# Patient Record
Sex: Female | Born: 1965 | Race: White | Hispanic: No | Marital: Married | Smoking: Current every day smoker
Health system: Southern US, Community
[De-identification: ages and names within clinical notes are randomized; demographics above are authoritative.]

---

## 2014-06-12 ENCOUNTER — Other Ambulatory Visit (HOSPITAL_COMMUNITY): Payer: Self-pay | Admitting: Internal Medicine

## 2014-06-12 DIAGNOSIS — R933 Abnormal findings on diagnostic imaging of other parts of digestive tract: Secondary | ICD-10-CM

## 2014-06-19 ENCOUNTER — Ambulatory Visit (HOSPITAL_COMMUNITY)
Admission: RE | Admit: 2014-06-19 | Discharge: 2014-06-19 | Disposition: A | Discharge status: Home / Self Care | Payer: PRIVATE HEALTH INSURANCE | Source: Ambulatory Visit | Attending: Internal Medicine | Admitting: Internal Medicine

## 2014-06-19 DIAGNOSIS — R933 Abnormal findings on diagnostic imaging of other parts of digestive tract: Secondary | ICD-10-CM

## 2014-06-19 DIAGNOSIS — R1909 Other intra-abdominal and pelvic swelling, mass and lump: Secondary | ICD-10-CM | POA: Insufficient documentation

## 2014-06-19 LAB — GLUCOSE, CAPILLARY: Glucose-Capillary: 95 mg/dL (ref 70–99)

## 2014-09-10 ENCOUNTER — Telehealth: Payer: Self-pay | Admitting: Oncology

## 2014-09-10 NOTE — Telephone Encounter (Signed)
C/D 09/10/14 for appt. 09/25/14

## 2014-09-10 NOTE — Telephone Encounter (Signed)
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

## 2014-09-25 ENCOUNTER — Other Ambulatory Visit (HOSPITAL_BASED_OUTPATIENT_CLINIC_OR_DEPARTMENT_OTHER): Payer: PRIVATE HEALTH INSURANCE

## 2014-09-25 ENCOUNTER — Other Ambulatory Visit: Payer: Self-pay | Admitting: Oncology

## 2014-09-25 ENCOUNTER — Encounter: Payer: Self-pay | Admitting: Oncology

## 2014-09-25 ENCOUNTER — Ambulatory Visit: Payer: PRIVATE HEALTH INSURANCE

## 2014-09-25 ENCOUNTER — Ambulatory Visit (HOSPITAL_BASED_OUTPATIENT_CLINIC_OR_DEPARTMENT_OTHER): Payer: PRIVATE HEALTH INSURANCE | Admitting: Oncology

## 2014-09-25 VITALS — BP 109/47 | HR 84 | Temp 98.0°F | Resp 18 | Wt 172.8 lb

## 2014-09-25 DIAGNOSIS — K6389 Other specified diseases of intestine: Secondary | ICD-10-CM

## 2014-09-25 DIAGNOSIS — R197 Diarrhea, unspecified: Secondary | ICD-10-CM

## 2014-09-25 DIAGNOSIS — K56609 Unspecified intestinal obstruction, unspecified as to partial versus complete obstruction: Secondary | ICD-10-CM | POA: Insufficient documentation

## 2014-09-25 DIAGNOSIS — K668 Other specified disorders of peritoneum: Secondary | ICD-10-CM

## 2014-09-25 DIAGNOSIS — R7989 Other specified abnormal findings of blood chemistry: Secondary | ICD-10-CM

## 2014-09-25 DIAGNOSIS — Z720 Tobacco use: Secondary | ICD-10-CM | POA: Insufficient documentation

## 2014-09-25 DIAGNOSIS — K5669 Other intestinal obstruction: Secondary | ICD-10-CM

## 2014-09-25 LAB — COMPREHENSIVE METABOLIC PANEL (CC13)
ALK PHOS: 169 U/L — AB (ref 40–150)
ALT: 17 U/L (ref 0–55)
AST: 16 U/L (ref 5–34)
Albumin: 3.6 g/dL (ref 3.5–5.0)
Anion Gap: 8 mEq/L (ref 3–11)
BUN: 10 mg/dL (ref 7.0–26.0)
CO2: 28 mEq/L (ref 22–29)
Calcium: 9.3 mg/dL (ref 8.4–10.4)
Chloride: 105 mEq/L (ref 98–109)
Creatinine: 0.9 mg/dL (ref 0.6–1.1)
Glucose: 83 mg/dl (ref 70–140)
POTASSIUM: 3.7 meq/L (ref 3.5–5.1)
SODIUM: 140 meq/L (ref 136–145)
TOTAL PROTEIN: 7.3 g/dL (ref 6.4–8.3)
Total Bilirubin: 0.38 mg/dL (ref 0.20–1.20)

## 2014-09-25 LAB — CBC WITH DIFFERENTIAL/PLATELET
BASO%: 0.6 % (ref 0.0–2.0)
Basophils Absolute: 0.1 10*3/uL (ref 0.0–0.1)
EOS%: 2.1 % (ref 0.0–7.0)
Eosinophils Absolute: 0.2 10*3/uL (ref 0.0–0.5)
HCT: 42.2 % (ref 34.8–46.6)
HGB: 13.7 g/dL (ref 11.6–15.9)
LYMPH%: 29.1 % (ref 14.0–49.7)
MCH: 29.6 pg (ref 25.1–34.0)
MCHC: 32.6 g/dL (ref 31.5–36.0)
MCV: 90.8 fL (ref 79.5–101.0)
MONO#: 0.6 10*3/uL (ref 0.1–0.9)
MONO%: 7 % (ref 0.0–14.0)
NEUT%: 61.2 % (ref 38.4–76.8)
NEUTROS ABS: 5 10*3/uL (ref 1.5–6.5)
Platelets: 224 10*3/uL (ref 145–400)
RBC: 4.65 10*6/uL (ref 3.70–5.45)
RDW: 14 % (ref 11.2–14.5)
WBC: 8.2 10*3/uL (ref 3.9–10.3)
lymph#: 2.4 10*3/uL (ref 0.9–3.3)

## 2014-09-25 NOTE — Progress Notes (Signed)
[facility] NEW PATIENT EVALUATION Medical Oncology   Name: Gwenlyn PerkingCynthia Corzine Date: 09/25/2014 MRN: [id number] DOB: September 18, 1966  REFERRING PHYSICIAN: Blaine HamperBhushan H. Pandya [facility] [phone])  CC: K.McClure (PCP, [facility], [place])   REASON FOR REFERRAL: possible carcinoid tumor   HISTORY OF PRESENT ILLNESS:Amber Summers is a 48 y.o. female who is seen in consultation, together with husband, at the request of Dr Samuella CotaPandya, with question of carcinoid tumor based on GI symptoms and recent PET. History is from outside records, which I have reviewed in detail and will have scanned into this EMR, from patient now, and PET done in this health system.  Patient has had evaluation by GI due to sporadic episodes of upper abdominal bloating associated with midback pain then vomiting, which have occurred in past 3 years, including 5-6 episodes in past year. Episodes can be months apart, have no known triggering factors and can last up to 12 hours or longer. Evaluation has included CT [facility] 01-14-2013 [facility], which reportedly showed 2 cm irregular intraluminal density in distal ileum ~ 5 cm from ileocecal valve, exophytic hypodensity posterior right hepatic lobe 9 mm stable from 2012 otherwise liver negative, lung bases clear, gallbladder unremarkable, kidneys/pancreas/adrenals ok, spleen with granulomas, aorta and IVC not remarkable. Patient tells me that she had colonoscopy with attempt to visualize distal ileum following that scan, with no findings to correlate (that report not included in information from GI now). With recurrent symptoms, she had repeat CT AP 05-28-2014 at [facility], reportedly with persistent soft tissue within the terminal ileum measuring ~ 2.3 x 2.6 cm, a periaortic lymph node 1.6 cm, a soft tissue mass in central mesentery 2.3 x 1.9 x 6.2 cm "similar in size to prior exam 01-14-13", lung bases clear, granuloma in  spleen and otherwise stable. She had capsule endoscopy August 2015,  reportedly not remarkable (that report not included in outside records now). She had 24 hour urine collection 06-30-2014 for 5-HIAA was 12mg /24 hours (0-14 mg/24 hrs). Serotonin level from 06-30-2014 was 1093 ng/ml (0-42- ng/ml). [facility]19[facility]2014 had no hypermetabolic uptake neck or chest, mild uptake (SUV 3.3) at rounded 2.3 cm lesion in central abdominal mesentery, mildly hypermetabolic 1.4 cm left periaortic node and 1.4 cm  periportal node with SUV max 3.1. The filling defect in distal ileum had SUV max 3.5. PET showed no bone lesions. Her last episode of the abdominal symptoms was in March 2015. She sometimes feels hot, especially in past year, but does not seem to have visible flushing. Note she had hysterectomy 2007 but does not know if this included oophorectomy, so not clear if this is perimenopausal hot flashes. She denies wheezing. She has occasional loose stools, including x2 yesterday, none today. Between the episodes of abdominal pain and vomiting, she generally feels well. She has no known chronic medical conditions tho she is a long and ongoing smoker; she does not see any other physicians regularly, including has not seen PCP in at least several years.   REVIEW OF SYSTEMS as above, also: Weight stable. Good energy, good appetite, regular diet. Occasional HA longstanding and unchanged. Reading glasses. Environmental sinus symptoms, and nasal septum "crooked". No history of thyroid concerns. Denies SOB, cough, sputum, hemoptysis, chest pain. Chest sore after prolonged vomiting. No cardiac symptoms, usual BP ~ 100/80. No bleeding. No arhtritis. No noted changes in breasts. No recent infectious illness. No fevers, chills, or drenching night sweats.   Remainder of full  10 point review of systems negative.   ALLERGIES: Review of patient's allergies indicates not on file.  PAST MEDICAL/ SURGICAL HISTORY:     Gravida 0 Hysterectomy 2007, does not know with certainty if ovaries also removed (age 21 then and denies any hot flashes after that surgery) Cyst left maxillary sinus "cyst on one ovary" "cyst behind bladder" >1 year since mammograms, last at [facility].  CURRENT MEDICATIONS: reviewed as listed now in EMR. Uses ibuprofen a couple of times/ month for HA, and OTC sinus meds  PHARMACY    SOCIAL HISTORY:  From [place], where she lives with husband. Works at [employer]. Has smoked x 32 years, able to quit x several months on one occasion using nicotine patches, now smokes cigars. No ETOH. Husband also ongoing smoker.  FAMILY HISTORY:  Mother HTN Father heart disease Sister tonsillar cancer (nonsmoker)          PHYSICAL EXAM:  weight is 172 lb 12.8 oz (78.382 kg). Her oral temperature is 98 F (36.7 C). Her blood pressure is 109/47 and her pulse is 84. Her respiration is 18.  Alert, pleasant, cooperative lady in no apparent discomfort, husband very supportive. Smells of tobacco. Easily mobile, good historian. Fully oriented and appropriate.  HEENT: normal hair pattern. PERRL, not icteric. Oral mucosa moist and clear, dentition ok. Neck supple without JVD or obvious thyroid mass. Neck supple.  RESPIRATORY: rather diminished BS thruout and hyperresonant to percussion, no wheezes or rales, no use of accessory muscles  CARDIAC/ VASCULAR: heart with RRR, clear heart sounds, no gallop. Peripheral pulses symmetrical  ABDOMEN: soft, not tender, not distended including upper abdomen, no appreciable HSM or mass. Normally active BS.   LYMPH NODES: no cervical, supraclavicular, axillary or inguinal adenopathy  BREASTS: she preferred not to undress for exam  NEUROLOGIC: CN, motor, sensory, cerebellar nonfocal. PSYCH appropriate mood and affect  SKIN: age-advanced weathering consistent with long tobacco  MUSCULOSKELETAL: symmetrical and good muscle mass. Back not tender. No clubbing, no  edema/cords/tenderness.     LABORATORY DATA:  Results for orders placed in visit on 09/25/14 (from the past 48 hour(s))  CBC WITH DIFFERENTIAL     Status: None   Collection Time    09/25/14 10:33 AM      Result Value Ref Range   WBC 8.2  3.9 - 10.3 10e3/uL   NEUT# 5.0  1.5 - 6.5 10e3/uL   HGB 13.7  11.6 - 15.9 g/dL   HCT 78.4  69.6 - 29.5 %   Platelets 224  145 - 400 10e3/uL   MCV 90.8  79.5 - 101.0 fL   MCH 29.6  25.1 - 34.0 pg   MCHC 32.6  31.5 - 36.0 g/dL   RBC 2.84  1.32 - 4.40 10e6/uL   RDW 14.0  11.2 - 14.5 %   lymph# 2.4  0.9 - 3.3 10e3/uL   MONO# 0.6  0.1 - 0.9 10e3/uL   Eosinophils Absolute 0.2  0.0 - 0.5 10e3/uL   Basophils Absolute 0.1  0.0 - 0.1 10e3/uL   NEUT% 61.2  38.4 - 76.8 %   LYMPH% 29.1  14.0 - 49.7 %   MONO% 7.0  0.0 - 14.0 %   EOS% 2.1  0.0 - 7.0 %   BASO% 0.6  0.0 - 2.0 %  COMPREHENSIVE METABOLIC PANEL (CC13)     Status: Abnormal   Collection Time    09/25/14 10:33 AM      Result Value Ref Range   Sodium 140  136 - 145 mEq/L   Potassium 3.7  3.5 - 5.1 mEq/L   Chloride 105  98 - 109 mEq/L   CO2 28  22 - 29 mEq/L   Glucose 83  70 - 140 mg/dl   BUN 16.1  7.0 - 09.6 mg/dL   Creatinine 0.9  0.6 - 1.1 mg/dL   Total Bilirubin 0.45  0.20 - 1.20 mg/dL   Alkaline Phosphatase 169 (*) 40 - 150 U/L   AST 16  5 - 34 U/L   ALT 17  0 - 55 U/L   Total Protein 7.3  6.4 - 8.3 g/dL   Albumin 3.6  3.5 - 5.0 g/dL   Calcium 9.3  8.4 - 40.9 mg/dL   Anion Gap 8  3 - 11 mEq/L      PATHOLOGY: None pertaining to present concerns.  Outside path from [facility] 02-10-2013 hyperplastic polyp cecum   RADIOGRAPHY: NUCLEAR MEDICINE PET SKULL BASE TO THIGH --  [facility] 06-19-2014 TECHNIQUE:  8.3 mCi F-18 FDG was injected intravenously. Full-ring PET imaging  was performed from the skull base to thigh after the radiotracer. CT  data was obtained and used for attenuation correction and anatomic  localization.  FASTING BLOOD GLUCOSE: Value: 95 mg/dl   COMPARISON: None.  FINDINGS:  NECK  No hypermetabolic lymph nodes in the neck.  CHEST  No hypermetabolic mediastinal or hilar nodes. No suspicious  pulmonary nodules on the CT scan.  ABDOMEN/PELVIS  Within the central abdominal mesentery is are rounded 2.3 cm lesion  with mild metabolic activity (SUV max 3.3). There is a mildly  metabolic left periaortic lymph node measuring 14 mm short axis  (image 11, series 4). There is a mildly metabolic periportal lymph  node measuring 14 mm (image 94) with SUV max 3.1.  There is a the filling defect within the most distal ileum  approximately 1-2 cm from the terminal ileum which measures 16 mm  (image 124, series 4). This endoluminal lesion has mild metabolic  activity also with SUV max equaled 3.5.  SKELETON  No focal hypermetabolic activity to suggest skeletal metastasis.  IMPRESSION:  1. 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 Recommend colonoscopy for  evaluation of distal ileal lesion. Also consider correlation with  biochemical analysis (serotonin levels) as well as potential  octreotide scan.  2. Mildly metabolic retroperitoneal and periportal lymph nodes are  likely related to above process.  PET images reviewed in PACs   DISCUSSION: I have discussed the case with my partner in GI Oncology. It is not clear to me from information available if this is carcinoid tumor, and I am concerned that she is having intermittent symptoms of bowel obstruction which may warrant surgical intervention. I have recommended that she be seen by GI oncology at tertiary care to obtain diagnosis and for treatment recommendations. As those specialists would be best to decide what additional imaging would be most helpful, I have not ordered any other scans now. I will refer to Dr Reginia Naas at [facility], as patient understands that her  insurance includes [facility] and as Dr Lattie Corns will be able to involve other specialties as appropriate. Patient and husband are in agreement with this plan. Due to timing of today's visit, referral will be made early next week. Patient understands that she should go to ED if she has significant symptoms, including protracted vomiting/ inability to take  po's.  We have discussed tobacco cessation strategies and I have strongly encouraged patient (and husband) to stop smoking.     IMPRESSION / PLAN:  1. Symptoms suggesting intermittent SBO likely from mass in terminal ileum, occurring sporadically for ~ past 3 years: urine 5HIAA not elevated. Serotonin elevated, tho I believe specificity of various serotonin assays not clear and this may not indicate carcinoid syndrome. Outside evaluation and PET as above. Note IR not comfortable with biopsy of the mesenteric mass at time of PET. I have mentioned that resection of the ileal mass might be consideration for diagnostic and therapeutic reasons. Refer to Dr Reginia Naas at [facility] for additional diagnostic evaluation. I am glad to see her back if I can be of help from here, but I have not made return appointment now. 2.long and ongoing tobacco abuse: I have strongly encouraged her to address this, especially as some surgery may be needed upcoming 3.overdue mammograms, which she has in [place] 4.post hysterectomy age 45, not clear from history if bilateral oophorectomy then  Patient and husband have had questions answered to their satisfaction and are in agreement with plan above. They can contact this office for questions or concerns, and I am glad to see her in future if needed.      Ericha Whittingham P, MD 09/25/2014 1:39 PM

## 2014-09-27 DIAGNOSIS — R7989 Other specified abnormal findings of blood chemistry: Secondary | ICD-10-CM | POA: Insufficient documentation

## 2014-09-28 ENCOUNTER — Encounter: Payer: Self-pay | Admitting: Oncology

## 2014-09-28 ENCOUNTER — Telehealth: Payer: Self-pay | Admitting: Oncology

## 2014-09-28 NOTE — Telephone Encounter (Signed)
Medical Oncology  Spoke with [name], new patient coordinator for Dr Reginia NaasMichael Morse with GI medical oncology at [facility], phone [phone]. I have asked Brandt Loosenarlena Clark to call Dewayne Hatchnn with insurance information as patient has [employer]. Will fax records from [facility] as well as my consult note 09-25-14/ labs/PET to Jimmy Picketattn Ann for Dr Lattie CornsMorse [phone]. Dr Lattie CornsMorse also usually needs path confirmation, which is not available. Dewayne Hatchnn will let us know if ok or not for new patient appointment after they review records. She will call back to my RN if needed [phone].  Ila McgillL.Arleta Ostrum, MD

## 2014-09-28 NOTE — Telephone Encounter (Signed)
MEDICAL RECORDS FAXED TO ANN @ DR. MICHAEL MORSE [phone]

## 2014-09-28 NOTE — Progress Notes (Signed)
Called Ann at [facility] per Dr. Darrold SpanLivesay to verify coverage information. Ann requested medical notes from the doctor who refer her to Dr. Katheren PullerLivesy. I advised Dewayne Hatchnn I will forward her request to HIM.

## 2014-09-29 ENCOUNTER — Telehealth: Payer: Self-pay | Admitting: Oncology

## 2014-09-29 ENCOUNTER — Telehealth: Payer: Self-pay | Admitting: *Deleted

## 2014-09-29 NOTE — Telephone Encounter (Addendum)
Dr. Reginia NaasMichael Morse called requesting to speak to Dr. Darrold SpanLivesay regarding referral. Gave Dr. Darrold SpanLivesay his cell number, [phone], to call him back.

## 2014-09-29 NOTE — Telephone Encounter (Signed)
FYI: Contact at [facility] is Dewayne Hatchnn - her phone number is [phone].

## 2014-09-29 NOTE — Telephone Encounter (Signed)
Faxed new patient information, PET scan, MD visit and lab results from 10/2 to Comfort[facility] at [facility]. Spoke with [name] and she received fax. Will pass along to Dr. Lattie CornsMorse. Told Ann to please call us back if any further information is needed.

## 2014-09-29 NOTE — Telephone Encounter (Signed)
Medical Oncology  Returned call to Dr Leslie Andrea at [facility] at his request, [phone]. Explained that I had met patient for first time last week, no path diagnosis, all work up has been by GI physician in [place] including PET in 05-2014, no octreotide scan and she has not seen Psychologist, sport and exercise. He will set her up to see surgeon same day as his consultation visit, as he also feels that she will need surgical evaluation.  Godfrey Pick, MD

## 2014-10-01 ENCOUNTER — Telehealth: Payer: Self-pay

## 2014-10-01 NOTE — Telephone Encounter (Signed)
Spoke with Ms. Amber Summers and told her that [name] from Dr. Moshe SalisburyMorse's office will be in touch with her to give information and directions for her appointment on 10-06-14 at 1230. Pt. Appreciated the follow up phone call.

## 2014-11-30 IMAGING — PT NM PET TUM IMG INITIAL (PI) SKULL BASE T - THIGH
6 of 7 series · 23 of 25 positions shown · non-contrast
Comparison: None.

CLINICAL DATA: Initial treatment strategy for mesenteric mass..

EXAM:
NUCLEAR MEDICINE PET SKULL BASE TO THIGH
TECHNIQUE: 8.3 mCi F-18 FDG was injected intravenously. Full-ring PET imaging
was performed from the skull base to thigh after the radiotracer. CT
data was obtained and used for attenuation correction and anatomic
localization.
FASTING BLOOD GLUCOSE:  Value: 95 mg/dl

[Series 4: ct sk_thigh 5.0 b31f · axial · 5.0mm · 0.98mm/px · z∈[-1454,-562]mm · 6 of 223 slices shown]
[im 1/223]
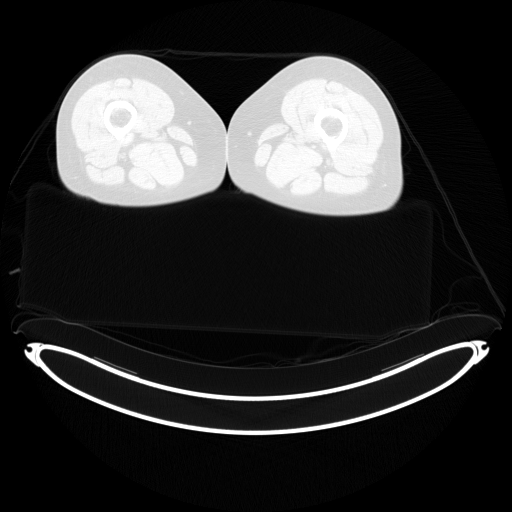
[im 45/223]
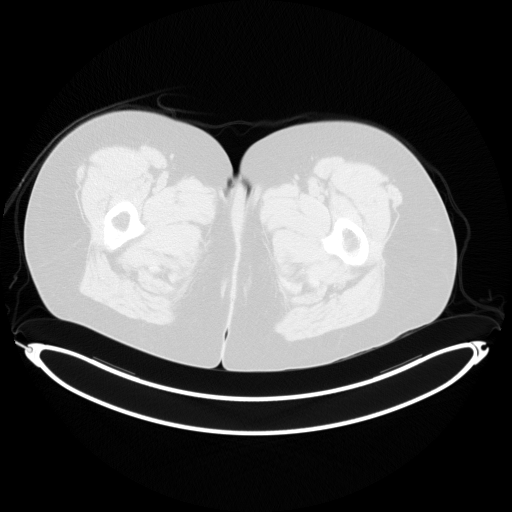
[im 89/223]
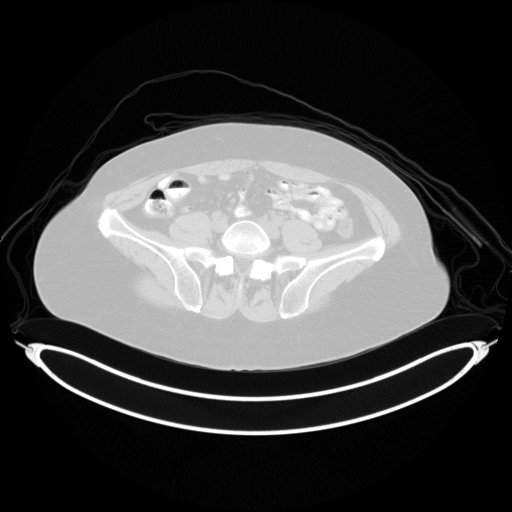
[im 134/223]
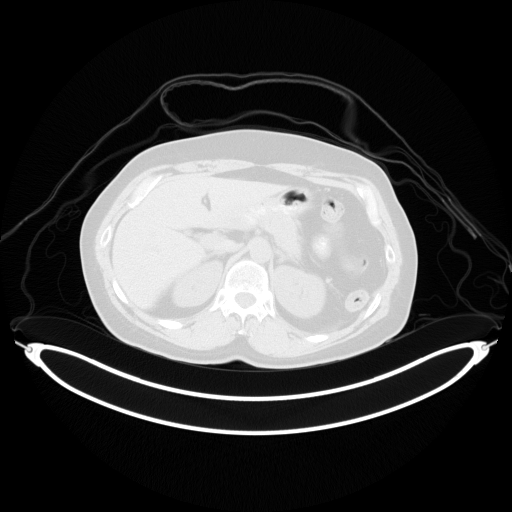
[im 178/223]
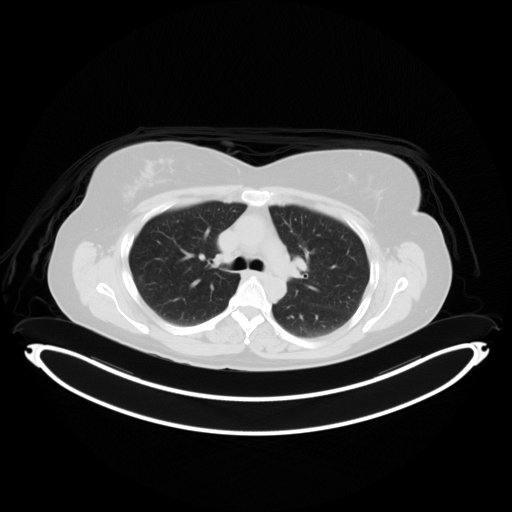
[im 223/223]
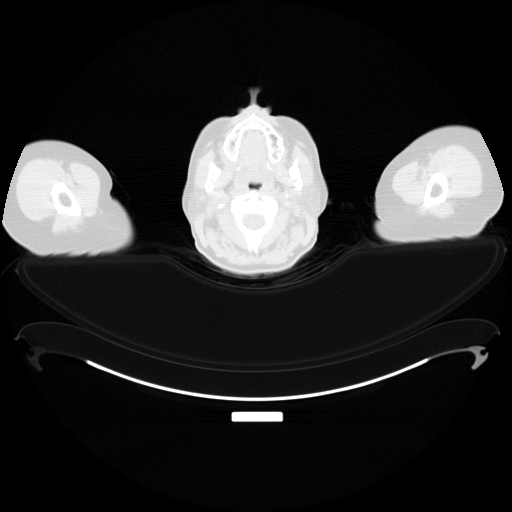

[Series 8: pet sk_thigh nac · axial · 5.0mm · 4.07mm/px · z∈[-1454,-562]mm · 7 of 224 slices shown]
[im 1/224]
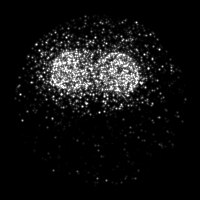
[im 38/224]
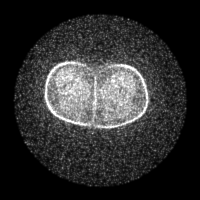
[im 75/224]
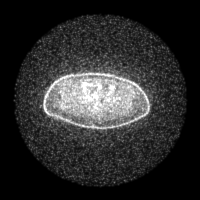
[im 112/224]
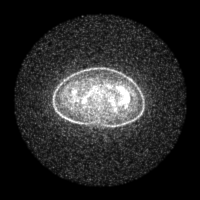
[im 149/224]
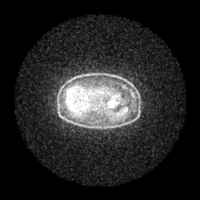
[im 186/224]
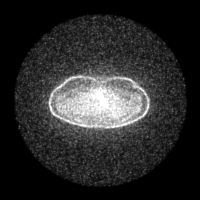
[im 224/224]
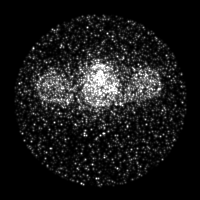

[Series 604: mip collection<mip range> · coronal · 1.85mm/px · 1 of 32 slices shown]
[im 1/32]
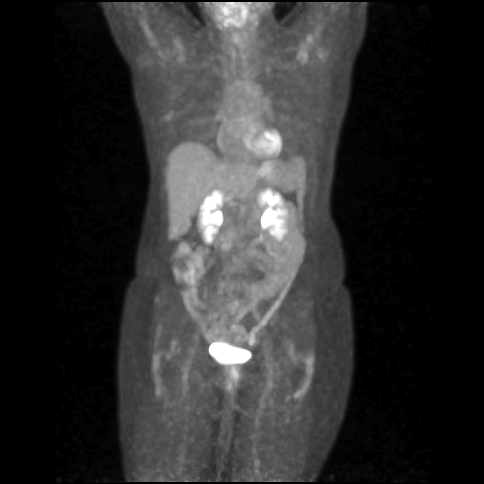

[Series 605: range-ct sk_thigh 5.0 (id)<alpha range> · 3 of 85 slices shown (1 of 2)]
[im 1/85]
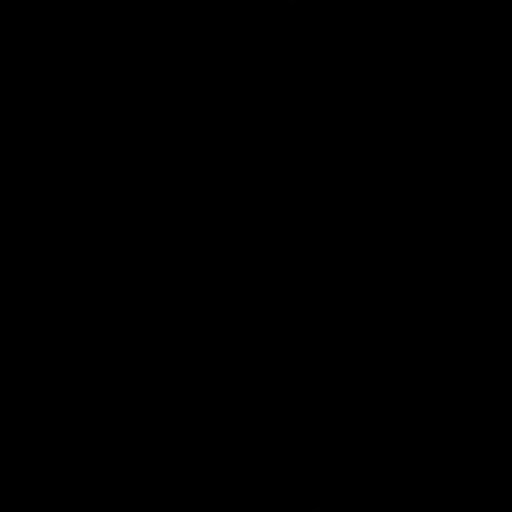
[im 43/85]
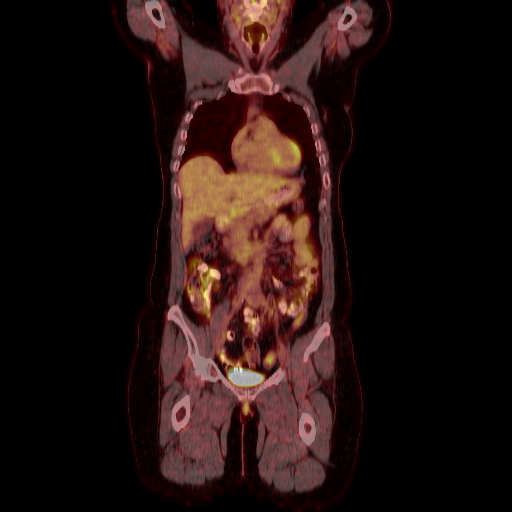
[im 85/85]
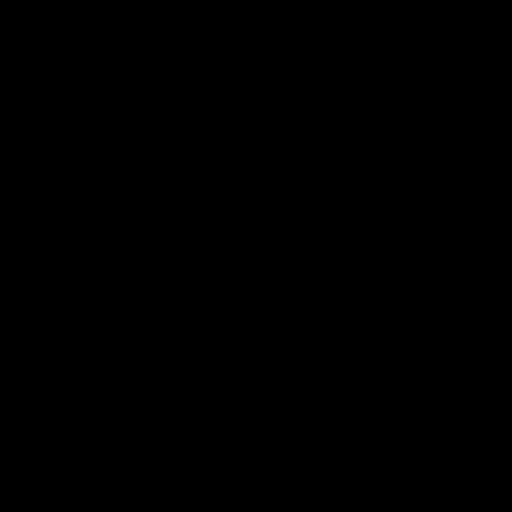

[Series 606: range-ct sk_thigh 5.0 (id)<alpha range> · 5 of 210 slices shown (2 of 2)]
[im 42/210]
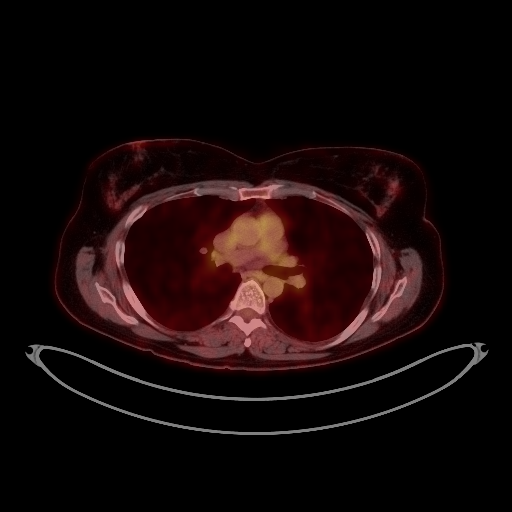
[im 84/210]
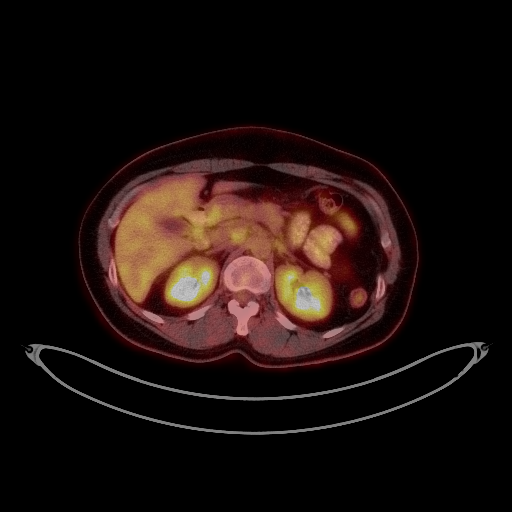
[im 126/210]
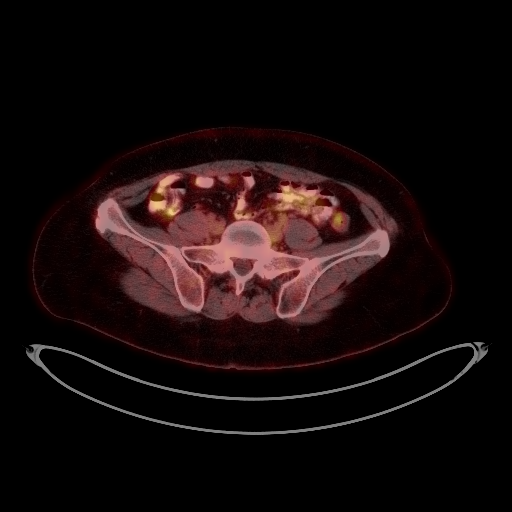
[im 168/210]
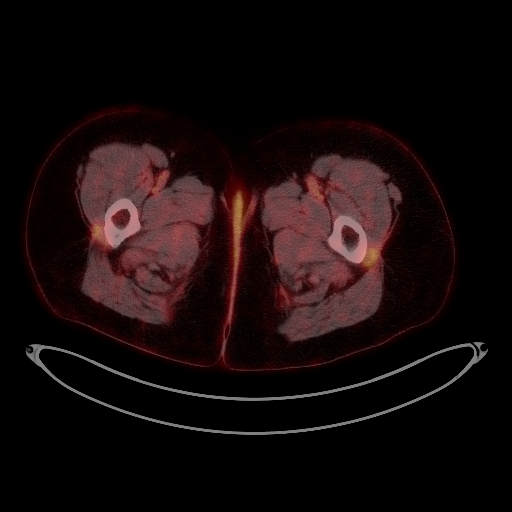
[im 210/210]
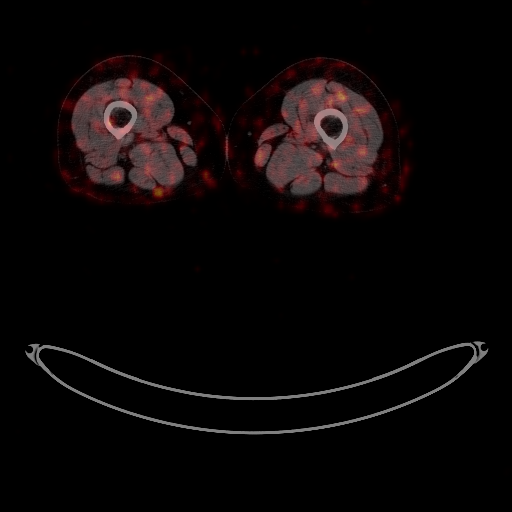

[Series 1032: results mm oncology reading · 0.62mm/px · 1 of 4 slices shown]
[im 1/4]
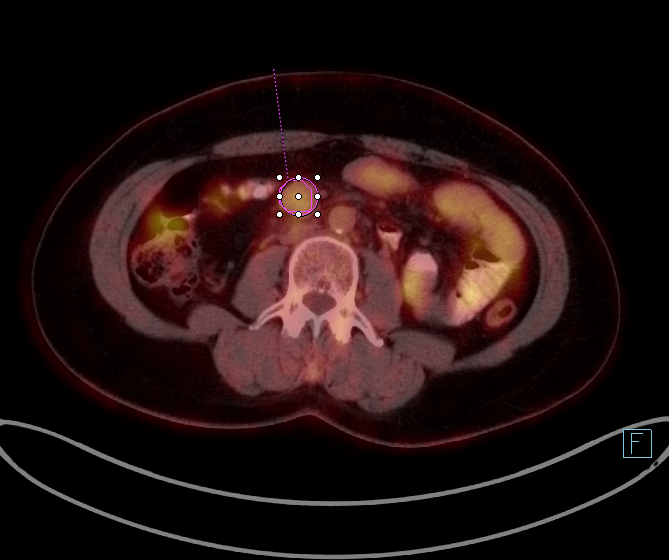

[23 of 25 positions shown; findings below may reference images not displayed]

FINDINGS: NECK

No hypermetabolic lymph nodes in the neck.

CHEST

No hypermetabolic mediastinal or hilar nodes. No suspicious
pulmonary nodules on the CT scan.

ABDOMEN/PELVIS

Within the central abdominal mesentery is are rounded 2.3 cm lesion
with mild metabolic activity (SUV max 3.3). There is a mildly
metabolic left periaortic lymph node measuring 14 mm short axis
(image 11, series 4). There is a mildly metabolic periportal lymph
node measuring 14 mm (image 94) with SUV max 3.1.

There is a the filling defect within the most distal ileum
approximately 1-2 cm from the terminal ileum which measures 16 mm
(image 124, series 4). This endoluminal lesion has mild metabolic
activity also with SUV max equaled 3.5.

SKELETON

No focal hypermetabolic activity to suggest skeletal metastasis.
IMPRESSION: 1. Mildly metabolic central mesenteric masses coupled with by mildly
metabolic endoluminal lesion within distal ileum is concerning for
carcinoid tumor with metastasis. Recommend colonoscopy for
evaluation of distal ileal lesion. Also consider correlation with
biochemical analysis (serotonin levels) as well as potential
octreotide scan.
2. Mildly metabolic retroperitoneal and periportal lymph nodes are
likely related to above process.

## 2022-03-25 DEATH — deceased
